# Patient Record
Sex: Male | Born: 1980 | Race: Black or African American | Hispanic: No | Marital: Single | Smoking: Current every day smoker
Health system: Southern US, Community
[De-identification: ages and names within clinical notes are randomized; demographics above are authoritative.]

---

## 2015-02-15 ENCOUNTER — Emergency Department (HOSPITAL_COMMUNITY)
Admission: EM | Admit: 2015-02-15 | Discharge: 2015-02-15 | Disposition: A | Discharge status: Home / Self Care | Payer: Self-pay | Attending: Emergency Medicine | Admitting: Emergency Medicine

## 2015-02-15 ENCOUNTER — Encounter (HOSPITAL_COMMUNITY): Payer: Self-pay | Admitting: Emergency Medicine

## 2015-02-15 ENCOUNTER — Emergency Department (HOSPITAL_COMMUNITY): Payer: Self-pay

## 2015-02-15 DIAGNOSIS — M79644 Pain in right finger(s): Secondary | ICD-10-CM | POA: Insufficient documentation

## 2015-02-15 DIAGNOSIS — Z720 Tobacco use: Secondary | ICD-10-CM | POA: Insufficient documentation

## 2015-02-15 MED ORDER — HYDROCODONE-ACETAMINOPHEN 5-325 MG PO TABS: 1.0000 | ORAL_TABLET | ORAL | Status: AC | PRN

## 2015-02-15 MED ORDER — CEPHALEXIN 500 MG PO CAPS: 500.0000 mg | ORAL_CAPSULE | Freq: Four times a day (QID) | ORAL | Status: AC

## 2015-02-15 NOTE — ED Provider Notes (Signed)
CSN: [id number]     Arrival date & time 02/15/15  [id number] History   First MD Initiated Contact with Patient 02/15/15 [phone]     Chief Complaint  Patient presents with  . Hand Pain  . Joint Swelling     (Consider location/radiation/quality/duration/timing/severity/associated sxs/prior Treatment) Patient is a 34 y.o. male presenting with hand pain.  Hand Pain This is a new problem. Episode onset: 4 days ago. The problem occurs constantly. The problem has not changed since onset.Pertinent negatives include no chest pain, no abdominal pain, no headaches and no shortness of breath. Nothing aggravates the symptoms. Nothing relieves the symptoms. He has tried nothing for the symptoms.    History reviewed. No pertinent past medical history. History reviewed. No pertinent past surgical history. History reviewed. No pertinent family history. History  Substance Use Topics  . Smoking status: Current Every Day Smoker  . Smokeless tobacco: Not on file  . Alcohol Use: No    Review of Systems  Respiratory: Negative for shortness of breath.   Cardiovascular: Negative for chest pain.  Gastrointestinal: Negative for abdominal pain.  Neurological: Negative for headaches.  All other systems reviewed and are negative.     Allergies  Review of patient's allergies indicates no known allergies.  Home Medications   Prior to Admission medications   Medication Sig Start Date End Date Taking? Authorizing Provider  cephALEXin (KEFLEX) 500 MG capsule Take 1 capsule (500 mg total) by mouth 4 (four) times daily. 02/15/15   Mirian MoMatthew Gentry, MD  HYDROcodone-acetaminophen (NORCO/VICODIN) 5-325 MG per tablet Take 1-2 tablets by mouth every 4 (four) hours as needed. 02/15/15   Mirian MoMatthew Gentry, MD   BP 126/58 mmHg  Pulse 58  Temp(Src) 98.5 F (36.9 C) (Oral)  Resp 18  Ht 5\' 11"  (1.803 m)  Wt 192 lb (87.091 kg)  BMI 26.79 kg/m2  SpO2 100% Physical Exam  Constitutional: He is oriented to person, place, and time.  He appears well-developed and well-nourished.  HENT:  Head: Normocephalic and atraumatic.  Eyes: Conjunctivae and EOM are normal.  Neck: Normal range of motion. Neck supple.  Cardiovascular: Normal rate, regular rhythm and normal heart sounds.   Pulmonary/Chest: Effort normal and breath sounds normal. No respiratory distress.  Abdominal: He exhibits no distension. There is no tenderness. There is no rebound and no guarding.  Musculoskeletal: Normal range of motion.  R 4th finger pain over DIP, distal phalanx, no external evidence of trauma  Neurological: He is alert and oriented to person, place, and time.  Skin: Skin is warm and dry.  Vitals reviewed.   ED Course  Procedures (including critical care time) Labs Review Labs Reviewed - No data to display  Imaging Review Dg Hand Complete Right  02/15/2015   CLINICAL DATA:  Right hand injury, with hyperextension to the ring finger and diffuse associated pain. Pain radiates up the forearm. Initial encounter.  EXAM: RIGHT HAND - COMPLETE 3+ VIEW  COMPARISON:  None.  FINDINGS: There is no evidence of fracture or dislocation. The joint spaces are preserved. The carpal rows are intact, and demonstrate normal alignment. The soft tissues are unremarkable in appearance.  IMPRESSION: No evidence of fracture or dislocation.   Electronically Signed   By: Roanna RaiderJeffery  Chang M.D.   On: 02/15/2015 06:35     EKG Interpretation None      MDM   Final diagnoses:  Finger pain, right    34 y.o. male without pertinent PMH presents with R 4th finger pain in setting of  trauma. Symptoms initially delayed, however worsened after repeated trauma over the next few days.  No fevers or infectious symptoms.  Exam as above with no external trauma, however some swelling over DIP with tenderness in random locations throughout finger.  No Knavels sign.  I spoke with Dr. Merlyn Lot of hand surgery who will help arrange for fu, recommended dorsal splinting which was applied.   Keflex prescription written along with 10 tables of norco.  DC home in stable condition..    I have reviewed all laboratory and imaging studies if ordered as above  1. Finger pain, right         Mirian Mo, MD 02/15/15 [phone]

## 2015-02-15 NOTE — ED Notes (Signed)
Patient with 2-3 days history of hand pain and swelling on the right.  Patient's ring finger on the right hand is the worst in pain and swelling.  Patient states that it started when he was moving a log.

## 2015-02-15 NOTE — Discharge Instructions (Signed)
Fingertip Infection When an infection is around the nail, it is called a paronychia. When it appears over the tip of the finger, it is called a felon. These infections are due to minor injuries or cracks in the skin. If they are not treated properly, they can lead to bone infection and permanent damage to the fingernail. Incision and drainage is necessary if a pus pocket (an abscess) has formed. Antibiotics and pain medicine may also be needed. Keep your hand elevated for the next 2-3 days to reduce swelling and pain. If a pack was placed in the abscess, it should be removed in 1-2 days by your caregiver. Soak the finger in warm water for 20 minutes 4 times daily to help promote drainage. Keep the hands as dry as possible. Wear protective gloves with cotton liners. See your caregiver for follow-up care as recommended.  HOME CARE INSTRUCTIONS   Keep wound clean, dry and dressed as suggested by your caregiver.  Soak in warm salt water for fifteen minutes, four times per day for bacterial infections.  Your caregiver will prescribe an antibiotic if a bacterial infection is suspected. Take antibiotics as directed and finish the prescription, even if the problem appears to be improving before the medicine is gone.  Only take over-the-counter or prescription medicines for pain, discomfort, or fever as directed by your caregiver. SEEK IMMEDIATE MEDICAL CARE IF:  There is redness, swelling, or increasing pain in the wound.  Pus or any other unusual drainage is coming from the wound.  An unexplained oral temperature above 102 F (38.9 C) develops.  You notice a foul smell coming from the wound or dressing. MAKE SURE YOU:   Understand these instructions.  Monitor your condition.  Contact your caregiver if you are getting worse or not improving. Document Released: 12/09/2004 Document Revised: 01/24/2012 Document Reviewed: 12/05/2008 [facility] Patient Information 2015 [place], [place]. This  information is not intended to replace advice given to you by your health care provider. Make sure you discuss any questions you have with your health care provider. Flexor Digitorum Profundus Rupture ([place] Finger) Flexor digitorum profundus rupture, commonly called [place] finger, is a condition where a person is unable to bend his or her own finger, without assistance. This is caused by injury to the tendon that bends the last joint of the finger. The tendon tears (ruptures), and sometimes pulls a piece of bone off with it. This is called an avulsion fracture. The injury is called [place] finger, because it often occurs when a player grabs another player's [place] or pants. SYMPTOMS   A "pop" or rip felt in the finger, at the time of injury.  Pain when moving the finger.  Inability to bend the finger, without assistance.  Full passive motion of the finger (can be bent with help).  Tenderness, swelling, and warmth of the injured finger.  Bruising after 48 hours.  Sometimes, a lump felt in the palm of the hand. CAUSES  The most common cause is forced straightening (extension) of a bent (flexed) finger. The force on the tendon is greater than it can withstand, causing it to rupture. [place] finger is less commonly the result of a cut (laceration). RISK INCREASES WITH:  Sports that involve grasping an object, such as a [place] (rugby, football (during tackling), and ice hockey when gloves are removed and a player grabs another player's [place]).  Poor hand strength and flexibility.  Previous finger injury. PREVENTION   Warm up and stretch properly before activity.  Maintain  physical fitness:  Hand and finger flexibility.  Muscle strength and endurance.  Taping, splinting, or protective strapping may be advised before activity.  Learn and use proper tackling technique. PROGNOSIS  [place] finger often requires surgery, followed by a 3 month recovery. RELATED COMPLICATIONS    Permanent deformity (inability to bend finger).  Stiffness of finger.  If untreated, unstable last joint.  Poor finger function.  Repeated rupture of the tendon.  Pain or weakness with gripping.  Prolonged disability.  Arthritis of the finger, especially if associated with a fracture.  Risks of surgery: infection, injury to nerves (numbness, weakness), bleeding, weakness, recurring tendon injury, finger stiffness. TREATMENT  [place] finger usually requires surgery. Before surgery, treatment involves restraint and ice and medicine, to reduce pain and inflammation. Surgery will involve reattaching the tendon to the bone. After surgery, the hand is restrained for protection during the healing process. Stretching and strengthening exercises will be needed, for you to regain strength and a full range of motion. Exercises may be completed at home or with a therapist. MEDICATION   If pain medicine is needed, nonsteroidal anti-inflammatory medicines (aspirin and ibuprofen), or other minor pain relievers (acetaminophen), are often advised.  Do not take pain medicine for 7 days before surgery.  Prescription pain relievers may be given. Use only as directed and only as much as you need. SEEK MEDICAL CARE IF:   Pain increases, despite treatment.  Any of the following occur after surgery:  You experience pain, numbness, or coldness in the finger.  Blue, gray, or dark color appears in the fingernails.  You develop signs of infection: fever, increased pain, swelling, redness, drainage of fluids, or bleeding in the affected area.  New, unexplained symptoms develop. (Drugs used in treatment may produce side effects.) Document Released: 11/01/2005 Document Revised: 01/24/2012 Document Reviewed: 02/13/2009 [employer] Patient Information 2015 [place], [place]. This information is not intended to replace advice given to you by your health care provider. Make sure you discuss any questions you have  with your health care provider.

## 2015-02-16 ENCOUNTER — Emergency Department (HOSPITAL_COMMUNITY)
Admission: EM | Admit: 2015-02-16 | Discharge: 2015-02-16 | Disposition: A | Discharge status: Home / Self Care | Payer: Self-pay | Attending: Emergency Medicine | Admitting: Emergency Medicine

## 2015-02-16 ENCOUNTER — Encounter (HOSPITAL_COMMUNITY): Payer: Self-pay

## 2015-02-16 DIAGNOSIS — L089 Local infection of the skin and subcutaneous tissue, unspecified: Secondary | ICD-10-CM | POA: Insufficient documentation

## 2015-02-16 DIAGNOSIS — M79644 Pain in right finger(s): Secondary | ICD-10-CM | POA: Insufficient documentation

## 2015-02-16 DIAGNOSIS — Z720 Tobacco use: Secondary | ICD-10-CM | POA: Insufficient documentation

## 2015-02-16 MED ORDER — HYDROCODONE-ACETAMINOPHEN 5-325 MG PO TABS: 1.0000 | ORAL_TABLET | Freq: Four times a day (QID) | ORAL | Status: AC | PRN

## 2015-02-16 MED ORDER — OXYCODONE-ACETAMINOPHEN 5-325 MG PO TABS
1.0000 | ORAL_TABLET | Freq: Once | ORAL | Status: AC
  Administered 2015-02-16: 1 via ORAL
  Filled 2015-02-16: qty 1

## 2015-02-16 NOTE — ED Provider Notes (Signed)
CSN: [id number]     Arrival date & time 02/16/15  2227 History  This chart was scribed for non-physician practitioner, Junius Finner, PA-C working with Jerelyn Scott, MD by Greggory Stallion, ED scribe. This patient was seen in room TR10C/TR10C and the patient's care was started at 11:15 PM.   Chief Complaint  Patient presents with  . Finger Injury   The history is provided by the patient. No language interpreter was used.    HPI Comments: Brandon Jimenez is a 34 y.o. male who presents to the Emergency Department complaining of worsening right fourth finger pain and swelling that started 5 days ago. He originally injured his finger by lifting a log and it falling onto his hand. Pt was evaluated for the same yesterday and discharged home with keflex and Vicodin. He woke up this morning with increased swelling in his hand and states he started noticing a green tint to the top of his fourth finger. Pt denies new injury. He has been taking the antibiotics as prescribed but states he is out of the pain medication. Pain is constant, throbbing, 8/10 at worst.  Pt denies numbness or tingling. He called the hand specialist and was told he would call him tomorrow morning to schedule an appointment. Pt denies history of diabetes. No new injuries.   History reviewed. No pertinent past medical history. History reviewed. No pertinent past surgical history. History reviewed. No pertinent family history. History  Substance Use Topics  . Smoking status: Current Every Day Smoker -- 0.50 packs/day    Types: Cigarettes  . Smokeless tobacco: Not on file  . Alcohol Use: No    Review of Systems  Musculoskeletal: Positive for joint swelling and arthralgias.  Neurological: Negative for numbness.  All other systems reviewed and are negative.  Allergies  Review of patient's allergies indicates no known allergies.  Home Medications   Prior to Admission medications   Medication Sig Start Date End Date Taking?  Authorizing Provider  cephALEXin (KEFLEX) 500 MG capsule Take 1 capsule (500 mg total) by mouth 4 (four) times daily. 02/15/15   Mirian Mo, MD  HYDROcodone-acetaminophen (NORCO/VICODIN) 5-325 MG per tablet Take 1-2 tablets by mouth every 4 (four) hours as needed. 02/15/15   Mirian Mo, MD  HYDROcodone-acetaminophen (NORCO/VICODIN) 5-325 MG per tablet Take 1-2 tablets by mouth every 6 (six) hours as needed. 02/16/15   Junius Finner, PA-C   BP 144/71 mmHg  Pulse 60  Temp(Src) 98.3 F (36.8 C) (Oral)  Resp 16  Ht  (1.803 m)  Wt 192 lb (87.091 kg)  BMI 26.79 kg/m2  SpO2 99%   Physical Exam  Constitutional: He is oriented to person, place, and time. He appears well-developed and well-nourished.  HENT:  Head: Normocephalic and atraumatic.  Eyes: EOM are normal.  Neck: Normal range of motion.  Cardiovascular: Normal rate.   Pulmonary/Chest: Effort normal.  Musculoskeletal: Normal range of motion.  Right hand mild to moderate edema of dorsal aspect. Non tender. Right ring finger with moderate edema. Minimal flexion and extension at MCP. Unable to flex at PIP or DIP. Diffuse tenderness, worse at distal aspect of finger. Mild erythema on dorsal aspect. Whitening of the skin with slight green tint to volar distal aspect of finger.  Neurological: He is alert and oriented to person, place, and time.  Skin: Skin is warm and dry.  Psychiatric: He has a normal mood and affect. His behavior is normal.  Nursing note and vitals reviewed.  ED Course  Procedures (including critical care time)  DIAGNOSTIC STUDIES: Oxygen Saturation is 99% on RA, normal by my interpretation.    COORDINATION OF CARE: 11:17 PM-Will consult hand surgery.   11:41 PM-Spoke with Dr. Melvyn Novasrtmann. He agrees pt can follow up in the morning.   Labs Review Labs Reviewed - No data to display  Imaging Review Dg Hand Complete Right  02/15/2015   CLINICAL DATA:  Right hand injury, with hyperextension to the  ring finger and diffuse associated pain. Pain radiates up the forearm. Initial encounter.  EXAM: RIGHT HAND - COMPLETE 3+ VIEW  COMPARISON:  None.  FINDINGS: There is no evidence of fracture or dislocation. The joint spaces are preserved. The carpal rows are intact, and demonstrate normal alignment. The soft tissues are unremarkable in appearance.  IMPRESSION: No evidence of fracture or dislocation.   Electronically Signed   By: Roanna RaiderJeffery  Chang M.D.   On: 02/15/2015 06:35     EKG Interpretation None      MDM   Final diagnoses:  Finger infection  Finger pain, right   Pt presenting to ED with c/o worsening Right ring finger pain and swelling. Pt currently on keflex. No new injuries. Pt is afebrile.  Consulted with Dr. Melvyn Novasrtmann, agrees pt can f/u with Dr. Merlyn LotKuzma as planned for tomorrow morning.  Pain medication (10 tabs) refilled. Home care instructions provided. Return precautions provided. Pt verbalized understanding and agreement with tx plan.   I personally performed the services described in this documentation, which was scribed in my presence. The recorded information has been reviewed and is accurate.   Junius Finnerrin O'Malley, PA-C 02/17/15 0025  Jerelyn ScottMartha Linker, MD 02/19/15 [phone]

## 2015-02-16 NOTE — ED Notes (Signed)
Pt seen in ED 02-15-15 for infection in right 4th fingertip, pain, swelling and discoloration.  Pt woke up this morning with increased pain to finger and swelling to top of right hand.  Pt is taking antibiotics and is out of pain medication.

## 2015-02-16 NOTE — ED Notes (Signed)
Pt made aware to return if symptoms worsen or if any life threatening symptoms occur.   

## 2015-02-16 NOTE — ED Notes (Signed)
Pt reports 4th digit on right hand swelling and was seen here yesterday and "given pills" and pt reports increased swelling since.  Pt does not know how he injured his finger, good cap refill and pulses intact, pt alert and oriented.

## 2015-02-19 ENCOUNTER — Emergency Department (HOSPITAL_COMMUNITY)
Admission: EM | Admit: 2015-02-19 | Discharge: 2015-02-19 | Disposition: A | Discharge status: Home / Self Care | Payer: Self-pay | Attending: Emergency Medicine | Admitting: Emergency Medicine

## 2015-02-19 ENCOUNTER — Encounter (HOSPITAL_COMMUNITY): Payer: Self-pay | Admitting: Emergency Medicine

## 2015-02-19 ENCOUNTER — Emergency Department (HOSPITAL_COMMUNITY): Payer: Self-pay

## 2015-02-19 DIAGNOSIS — L03011 Cellulitis of right finger: Secondary | ICD-10-CM | POA: Insufficient documentation

## 2015-02-19 DIAGNOSIS — L02511 Cutaneous abscess of right hand: Secondary | ICD-10-CM

## 2015-02-19 DIAGNOSIS — Z792 Long term (current) use of antibiotics: Secondary | ICD-10-CM | POA: Insufficient documentation

## 2015-02-19 DIAGNOSIS — Z23 Encounter for immunization: Secondary | ICD-10-CM | POA: Insufficient documentation

## 2015-02-19 DIAGNOSIS — Z720 Tobacco use: Secondary | ICD-10-CM | POA: Insufficient documentation

## 2015-02-19 LAB — CBC WITH DIFFERENTIAL/PLATELET
Basophils Absolute: 0 10*3/uL (ref 0.0–0.1)
Basophils Relative: 0 % (ref 0–1)
EOS ABS: 0.2 10*3/uL (ref 0.0–0.7)
Eosinophils Relative: 2 % (ref 0–5)
HEMATOCRIT: 37.1 % — AB (ref 39.0–52.0)
HEMOGLOBIN: 11.9 g/dL — AB (ref 13.0–17.0)
LYMPHS ABS: 2.1 10*3/uL (ref 0.7–4.0)
Lymphocytes Relative: 23 % (ref 12–46)
MCH: 29.4 pg (ref 26.0–34.0)
MCHC: 32.1 g/dL (ref 30.0–36.0)
MCV: 91.6 fL (ref 78.0–100.0)
Monocytes Absolute: 0.8 10*3/uL (ref 0.1–1.0)
Monocytes Relative: 9 % (ref 3–12)
Neutro Abs: 6.2 10*3/uL (ref 1.7–7.7)
Neutrophils Relative %: 66 % (ref 43–77)
PLATELETS: 174 10*3/uL (ref 150–400)
RBC: 4.05 MIL/uL — AB (ref 4.22–5.81)
RDW: 12 % (ref 11.5–15.5)
WBC: 9.3 10*3/uL (ref 4.0–10.5)

## 2015-02-19 MED ORDER — AMOXICILLIN-POT CLAVULANATE 875-125 MG PO TABS: 1.0000 | ORAL_TABLET | Freq: Two times a day (BID) | ORAL | Status: AC

## 2015-02-19 MED ORDER — AMOXICILLIN-POT CLAVULANATE 875-125 MG PO TABS
1.0000 | ORAL_TABLET | Freq: Once | ORAL | Status: AC
  Administered 2015-02-19: 1 via ORAL
  Filled 2015-02-19: qty 1

## 2015-02-19 MED ORDER — LIDOCAINE-EPINEPHRINE (PF) 2 %-1:200000 IJ SOLN
20.0000 mL | Freq: Once | INTRAMUSCULAR | Status: AC
  Administered 2015-02-19: 20 mL
  Filled 2015-02-19: qty 20

## 2015-02-19 MED ORDER — TETANUS-DIPHTH-ACELL PERTUSSIS 5-2.5-18.5 LF-MCG/0.5 IM SUSP
0.5000 mL | Freq: Once | INTRAMUSCULAR | Status: AC
  Administered 2015-02-19: 0.5 mL via INTRAMUSCULAR
  Filled 2015-02-19: qty 0.5

## 2015-02-19 NOTE — ED Provider Notes (Signed)
CSN: [id number]     Arrival date & time 02/19/15  2036 History   First MD Initiated Contact with Patient 02/19/15 2056     Chief Complaint  Patient presents with  . Hand Pain      HPI Patient reports he initially injured his right ring finger approximately 9 days ago.  A log fell on it.  He was initially seen in the emergency department on April 3 was told to follow-up with hand surgery.  He was seen again the next day and appeared to have developing pus again the patient was recommended to follow-up with hand surgery which he did not.  He presents now with worsening pain of his right ring finger and swelling to his right hand.  He denies fevers or chills at home.  He does report that he bites his nails.  He states this began however after trauma from the log.  Initial x-rays obtained on his initial ER visit were negative for fracture or foreign body.  His pain is severe in severity and worse with movement and palpation of his right ring finger  History reviewed. No pertinent past medical history. History reviewed. No pertinent past surgical history. No family history on file. History  Substance Use Topics  . Smoking status: Current Every Day Smoker -- 0.50 packs/day    Types: Cigarettes  . Smokeless tobacco: Not on file  . Alcohol Use: No    Review of Systems  All other systems reviewed and are negative.     Allergies  Review of patient's allergies indicates no known allergies.  Home Medications   Prior to Admission medications   Medication Sig Start Date End Date Taking? Authorizing Provider  cephALEXin (KEFLEX) 500 MG capsule Take 1 capsule (500 mg total) by mouth 4 (four) times daily. 02/15/15  Yes Mirian MoMatthew Gentry, MD  HYDROcodone-acetaminophen (NORCO/VICODIN) 5-325 MG per tablet Take 1-2 tablets by mouth every 4 (four) hours as needed. 02/15/15  Yes Mirian MoMatthew Gentry, MD  amoxicillin-clavulanate (AUGMENTIN) 875-125 MG per tablet Take 1 tablet by mouth every 12 (twelve) hours. 02/19/15    Azalia BilisKevin Bilaal Leib, MD  HYDROcodone-acetaminophen (NORCO/VICODIN) 5-325 MG per tablet Take 1-2 tablets by mouth every 6 (six) hours as needed. 02/16/15   Junius FinnerErin O'Malley, PA-C   BP 93/76 mmHg  Pulse 73  Temp(Src) 98.2 F (36.8 C) (Oral)  Resp 16  Ht 5\' 11"  (1.803 m)  Wt 192 lb (87.091 kg)  BMI 26.79 kg/m2  SpO2 94% Physical Exam  Constitutional: He is oriented to person, place, and time. He appears well-developed and well-nourished.  HENT:  Head: Normocephalic.  Eyes: EOM are normal.  Neck: Normal range of motion.  Pulmonary/Chest: Effort normal.  Abdominal: He exhibits no distension.  Musculoskeletal: Normal range of motion.  Tense swelling of the distal phalanx of the right ring finger along the volar aspect.  There is also some associated swelling and tenderness of the mid phalanx of the right ring finger although he is able to flex the right ring finger at the MCP and PIP joint without significant pain.  No tenderness along the flexor sheath.  He does have some associated swelling without erythema of the dorsum of his right hand.  Neurological: He is alert and oriented to person, place, and time.  Psychiatric: He has a normal mood and affect.  Nursing note and vitals reviewed.   ED Course  Procedures (including critical care time)  INCISION AND DRAINAGE Performed by: Lyanne CoAMPOS,Benedetto Ryder M Consent: Verbal consent obtained. Risks and benefits:  risks, benefits and alternatives were discussed Time out performed prior to procedure Type: abscess Body area: distal phalynx right ring finger Anesthesia: nerve block (see note) Incision was made with a scalpel. Complexity: complex Blunt dissection to break up loculations Drainage: purulent Drainage amount: moderate Packing material: 1/4 inch guaze Patient tolerance: Patient tolerated the procedure well with no immediate complications.  NERVE BLOCK Performed by: Lyanne Co Consent: Verbal consent obtained. Required items: required blood  products, implants, devices, and special equipment available Time out: Immediately prior to procedure a "time out" was called to verify the correct patient, procedure, equipment, support staff and site/side marked as required. Indication: incision and drainage Nerve block body site: digital nerves, right ring finger Preparation: Patient was prepped and draped in the usual sterile fashion. Needle gauge: 24 G Location technique: anatomical landmarks Local anesthetic: lidocaine 2% Anesthetic total: 8 ml Outcome: pain improved Patient tolerance: Patient tolerated the procedure well with no immediate complications.     Labs Review Labs Reviewed  CBC WITH DIFFERENTIAL/PLATELET - Abnormal; Notable for the following:    RBC 4.05 (*)    Hemoglobin 11.9 (*)    HCT 37.1 (*)    All other components within normal limits  WOUND CULTURE    Imaging Review Dg Finger Ring Right  02/19/2015   CLINICAL DATA:  Injury 02/12/2015, tree branch fell on hand  EXAM: RIGHT RING FINGER 2+V  COMPARISON:  02/15/2015  FINDINGS: Three views of the right ring finger submitted. No acute fracture or subluxation. No radiopaque foreign body.  IMPRESSION: Negative.   Electronically Signed   By: Natasha Mead M.D.   On: 02/19/2015 21:55  I personally reviewed the imaging tests through PACS system I reviewed available ER/hospitalization records through the EMR    EKG Interpretation None      MDM   Final diagnoses:  Felon, right    Right ring finger felon drained.  I discussed the case with hand surgery, Dr. Mina Marble, who will see the patient in follow-up in the morning.  Patient will be switched to Augmentin.  No signs of osteo on x-ray.  White count normal.  Wound culture sent    Azalia Bilis, MD 02/19/15 [phone]

## 2015-02-19 NOTE — ED Notes (Signed)
Pt reports hand swelling x1 week and swelling in ring finger on right hand.  Compared to Sunday when seen in [facility], pt hand has increased in swelling and redness.  Pt pulses present, alert and oriented.

## 2015-02-19 NOTE — Discharge Instructions (Signed)

## 2015-02-19 NOTE — ED Notes (Signed)
Pt reports continued pain and swelling in R hand after hitting it on something last week. Has been seen here twice for this already. sts it seems like its getting worse. No drainage noted. Denies fevers.

## 2015-02-22 ENCOUNTER — Telehealth (HOSPITAL_BASED_OUTPATIENT_CLINIC_OR_DEPARTMENT_OTHER): Payer: Self-pay | Admitting: Emergency Medicine

## 2015-02-22 LAB — WOUND CULTURE

## 2015-02-22 NOTE — Progress Notes (Signed)
ED Antimicrobial Stewardship Positive Culture Follow Up   Brandon SomanDeRon Mellen is an 34 y.o. male who presented to [facility] on 02/19/2015 with a chief complaint of  Chief Complaint  Patient presents with  . Hand Pain    Recent Results (from the past 720 hour(s))  Wound culture     Status: None   Collection Time: 02/19/15 11:18 PM  Result Value Ref Range Status   Specimen Description WOUND RIGHT HAND  Final   Special Requests RING FINGER  Final   Gram Stain   Final    ABUNDANT WBC PRESENT, PREDOMINANTLY PMN NO SQUAMOUS EPITHELIAL CELLS SEEN FEW GRAM POSITIVE COCCI IN CLUSTERS Performed at [facility]    Culture   Final    MODERATE METHICILLIN RESISTANT STAPHYLOCOCCUS AUREUS Note: RIFAMPIN AND GENTAMICIN SHOULD NOT BE USED AS SINGLE DRUGS FOR TREATMENT OF STAPH INFECTIONS. CRITICAL RESULT CALLED TO, READ BACK BY AND VERIFIED WITH: SHANNON G.4/9 @1025  BY REAMM Performed at [facility]    Report Status 02/22/2015 FINAL  Final   Organism ID, Bacteria METHICILLIN RESISTANT STAPHYLOCOCCUS AUREUS  Final      Susceptibility   Methicillin resistant staphylococcus aureus - MIC*    CLINDAMYCIN <=0.25 SENSITIVE Sensitive     ERYTHROMYCIN <=0.25 SENSITIVE Sensitive     GENTAMICIN <=0.5 SENSITIVE Sensitive     LEVOFLOXACIN <=0.12 SENSITIVE Sensitive     OXACILLIN >=4 RESISTANT Resistant     PENICILLIN >=0.5 RESISTANT Resistant     RIFAMPIN <=0.5 SENSITIVE Sensitive     TRIMETH/SULFA <=10 SENSITIVE Sensitive     VANCOMYCIN 1 SENSITIVE Sensitive     TETRACYCLINE <=1 SENSITIVE Sensitive     * MODERATE METHICILLIN RESISTANT STAPHYLOCOCCUS AUREUS    [x]  Treated with Augmentin, organism not covered by prescribed antimicrobial  New antibiotic prescription: Bactrim DS 2 tabs twice daily for 7 days  ED Provider: Everlene FarrierWilliam Dansie, PA-C  [name], [name] 02/22/2015, 5:38 PM Infectious Diseases Pharmacist Phone# [phone]

## 2015-02-22 NOTE — Telephone Encounter (Signed)
Lab called positive wound culture, +MRSA. Chart sent to EDP for review.

## 2015-02-23 ENCOUNTER — Telehealth: Payer: Self-pay | Admitting: Emergency Medicine

## 2015-02-23 NOTE — Telephone Encounter (Signed)
Post ED Visit - Positive Culture Follow-up: Successful Patient Follow-Up  Culture assessed and recommendations reviewed by: []  Wes Dulaney, Pharm.D., BCPS []  Celedonio MiyamotoJeremy Frens, Pharm.D., BCPS [x]  Georgina PillionElizabeth Martin, Pharm.D., BCPS []  GlencoeMinh Pham, [address]Pharm[address]D., BCPS, AAHIVP []  Estella HuskMichelle Turner, Pharm.D., BCPS, AAHIVP []  Red ChristiansSamson Lee, Pharm.D. []  Cassie Roseanne RenoStewart, [place]arm[place]D.  Positive wound culture  []  Patient discharged without antimicrobial prescription and treatment is now indicated [x]  Organism is resistant to prescribed ED discharge antimicrobial []  Patient with positive blood cultures  Changes discussed with ED provider: Will Marijo Fileansie PA New antibiotic prescription: D/c Augmentin, start Bactrim DS two tabs BID x seven days    02/23/15 @ 0905 - left voicemail for patient to call office #, will continue attempts to contact patient.   Jiles HaroldGammons, Kathyann Spaugh Chaney 02/23/2015, 9:21 AM

## 2015-02-24 ENCOUNTER — Telehealth (HOSPITAL_COMMUNITY): Payer: Self-pay | Admitting: *Deleted

## 2015-02-25 ENCOUNTER — Telehealth (HOSPITAL_BASED_OUTPATIENT_CLINIC_OR_DEPARTMENT_OTHER): Payer: Self-pay | Admitting: Emergency Medicine

## 2015-03-26 ENCOUNTER — Telehealth (HOSPITAL_BASED_OUTPATIENT_CLINIC_OR_DEPARTMENT_OTHER): Payer: Self-pay | Admitting: Emergency Medicine

## 2015-03-26 NOTE — Telephone Encounter (Signed)
Lost to followup 

## 2016-03-04 IMAGING — CR DG FINGER RING 2+V*R*
3 series · 3 of 3 positions shown · non-contrast
Comparison: 02/15/2015

CLINICAL DATA: Injury 02/12/2015, tree branch fell on hand

EXAM:
RIGHT RING FINGER 2+V

[x finger pa right]
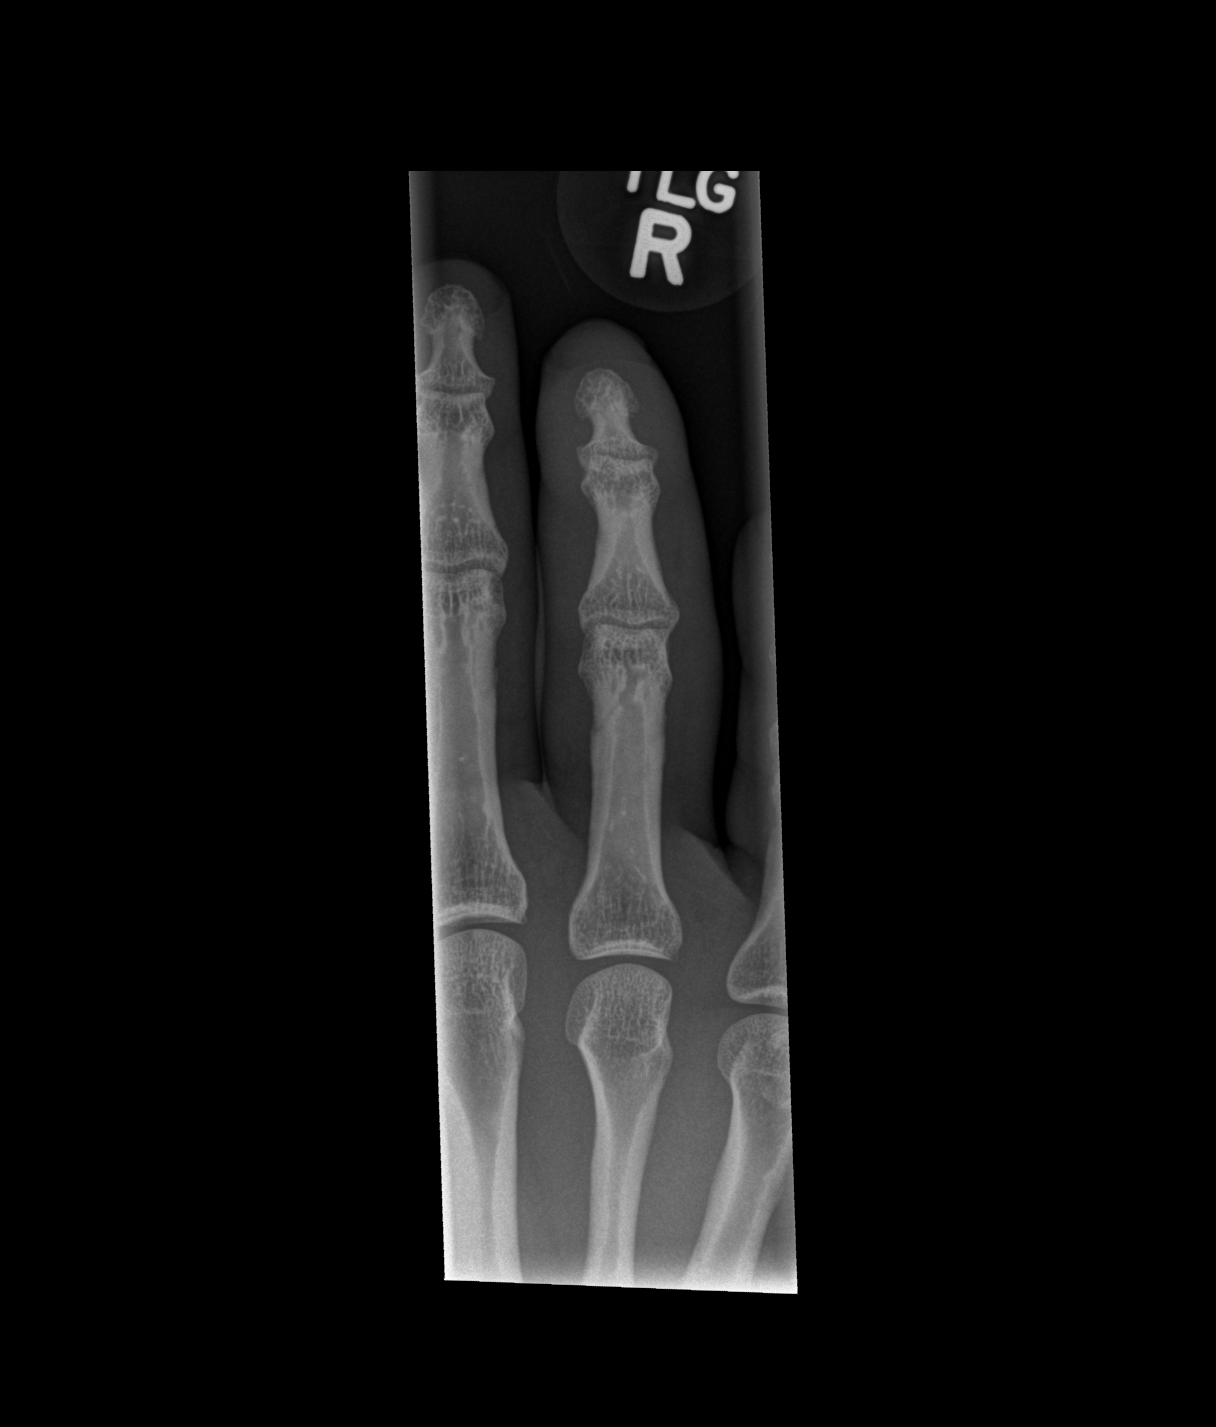

[x finger obl right]
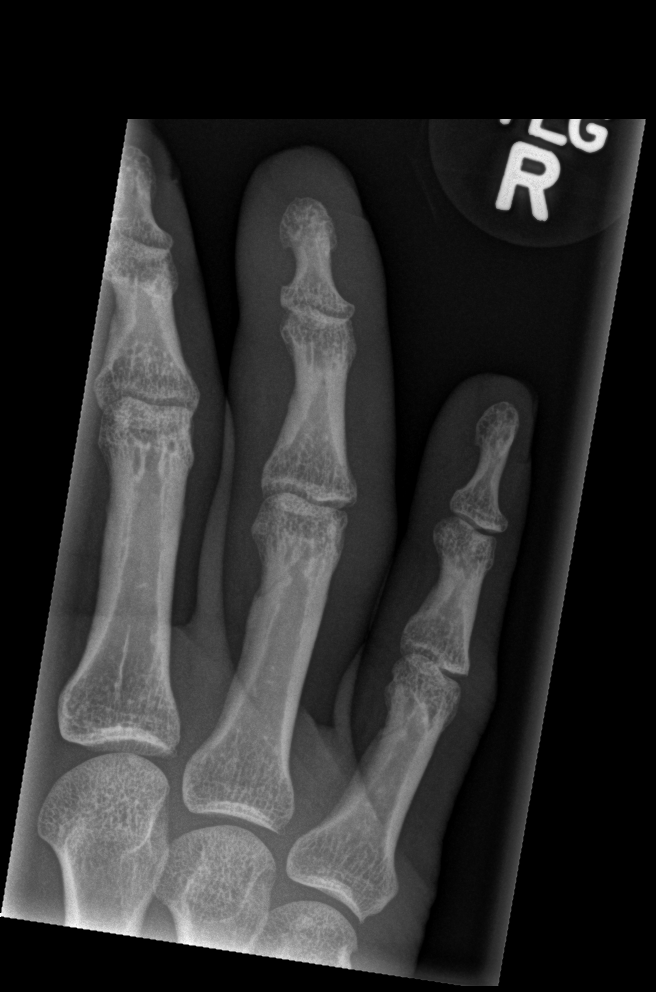

[x finger lat right]
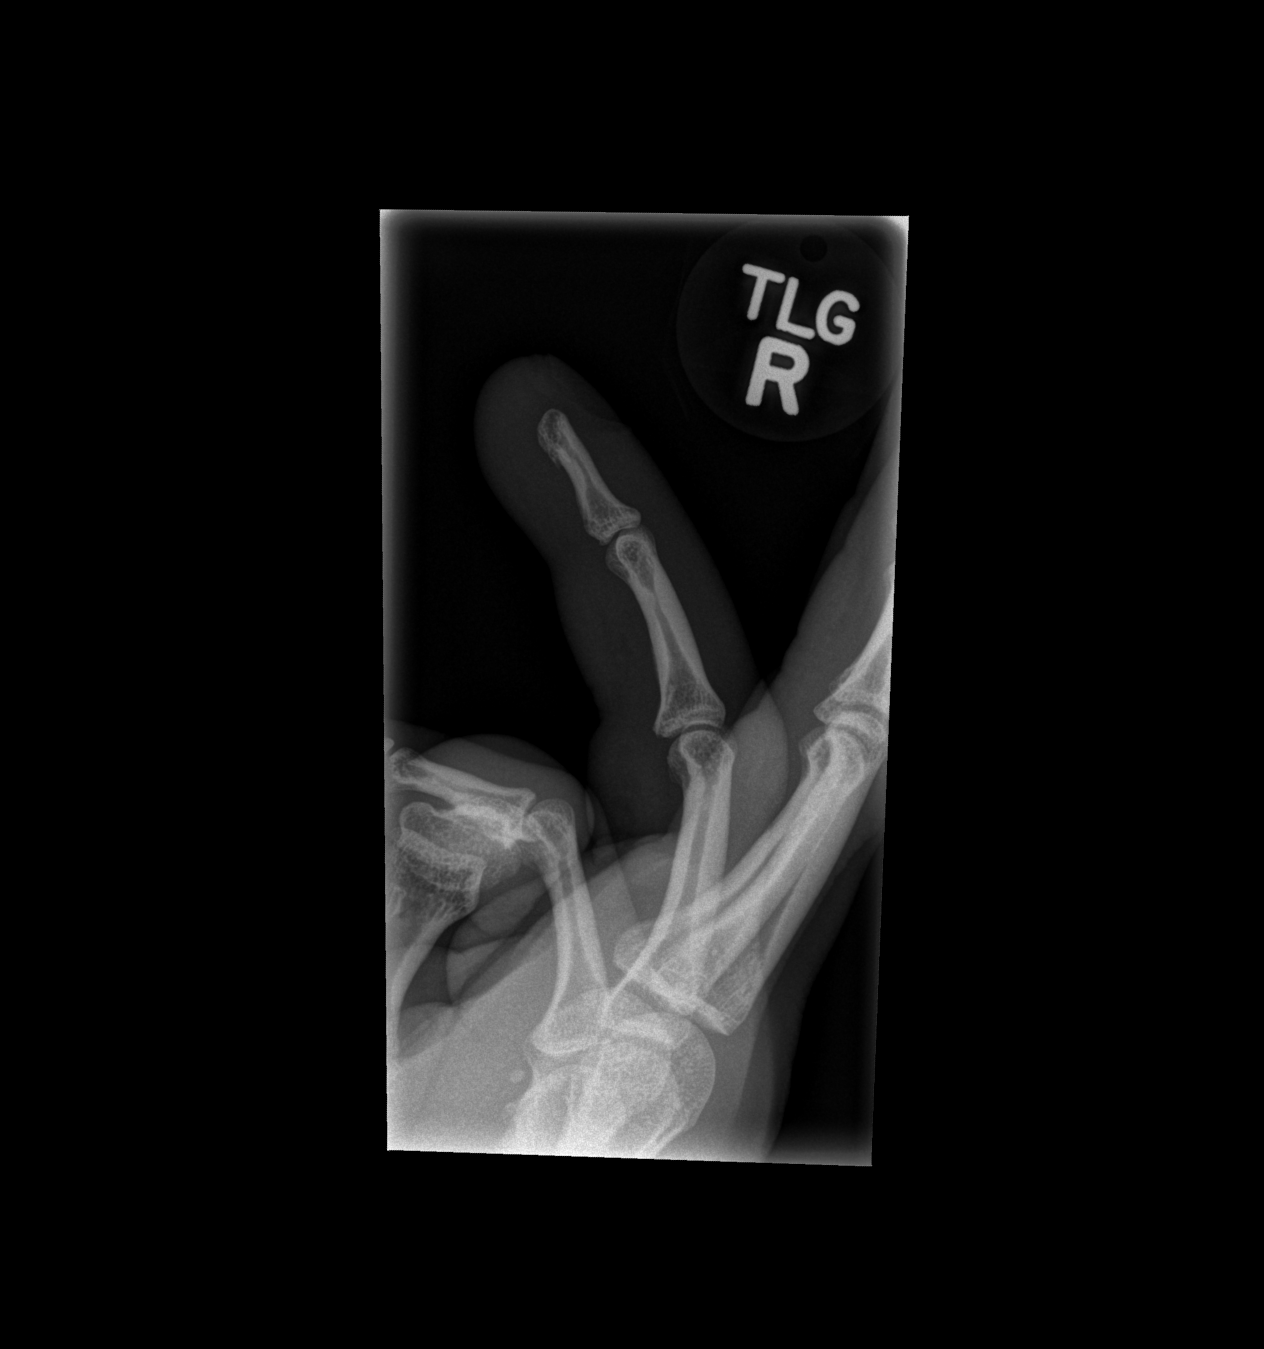

[3 of 3 positions shown; findings below may reference images not displayed]

FINDINGS: Three views of the right ring finger submitted. No acute fracture or
subluxation. No radiopaque foreign body.
IMPRESSION: Negative.
